# Patient Record
Sex: Female | Born: 1995 | Race: Black or African American | Hispanic: No | Marital: Single | Smoking: Never smoker
Health system: Southern US, Community
[De-identification: ages and names within clinical notes are randomized; demographics above are authoritative.]

## PROBLEM LIST (undated history)

## (undated) ENCOUNTER — Inpatient Hospital Stay (HOSPITAL_COMMUNITY): Payer: Self-pay

---

## 2015-11-26 ENCOUNTER — Encounter (HOSPITAL_COMMUNITY): Payer: Self-pay

## 2015-11-26 ENCOUNTER — Inpatient Hospital Stay (HOSPITAL_COMMUNITY): Payer: Self-pay

## 2015-11-26 ENCOUNTER — Inpatient Hospital Stay (HOSPITAL_COMMUNITY)
Admission: AD | Admit: 2015-11-26 | Discharge: 2015-11-26 | Disposition: A | Discharge status: Home / Self Care | Payer: BLUE CROSS/BLUE SHIELD | Source: Ambulatory Visit | Attending: Obstetrics & Gynecology | Admitting: Obstetrics & Gynecology

## 2015-11-26 DIAGNOSIS — O2341 Unspecified infection of urinary tract in pregnancy, first trimester: Secondary | ICD-10-CM | POA: Diagnosis not present

## 2015-11-26 DIAGNOSIS — Z91018 Allergy to other foods: Secondary | ICD-10-CM | POA: Insufficient documentation

## 2015-11-26 DIAGNOSIS — Z3A08 8 weeks gestation of pregnancy: Secondary | ICD-10-CM

## 2015-11-26 DIAGNOSIS — O209 Hemorrhage in early pregnancy, unspecified: Secondary | ICD-10-CM | POA: Diagnosis not present

## 2015-11-26 DIAGNOSIS — K5900 Constipation, unspecified: Secondary | ICD-10-CM | POA: Insufficient documentation

## 2015-11-26 DIAGNOSIS — N3000 Acute cystitis without hematuria: Secondary | ICD-10-CM

## 2015-11-26 DIAGNOSIS — Z3A01 Less than 8 weeks gestation of pregnancy: Secondary | ICD-10-CM | POA: Insufficient documentation

## 2015-11-26 LAB — WET PREP, GENITAL
Clue Cells Wet Prep HPF POC: NONE SEEN
SPERM: NONE SEEN
TRICH WET PREP: NONE SEEN
YEAST WET PREP: NONE SEEN

## 2015-11-26 LAB — POCT PREGNANCY, URINE: PREG TEST UR: POSITIVE — AB

## 2015-11-26 LAB — URINALYSIS, ROUTINE W REFLEX MICROSCOPIC
Glucose, UA: NEGATIVE mg/dL
KETONES UR: 40 mg/dL — AB
NITRITE: POSITIVE — AB
Protein, ur: >300 mg/dL — AB
Specific Gravity, Urine: 1.02 (ref 1.005–1.030)
pH: 5 (ref 5.0–8.0)

## 2015-11-26 LAB — HCG, QUANTITATIVE, PREGNANCY: hCG, Beta Chain, Quant, S: 19 m[IU]/mL — ABNORMAL HIGH (ref <5)

## 2015-11-26 LAB — URINE MICROSCOPIC-ADD ON

## 2015-11-26 LAB — CBC WITH DIFFERENTIAL/PLATELET
BASOS ABS: 0 10*3/uL (ref 0.0–0.1)
Basophils Relative: 0 %
Eosinophils Absolute: 0.1 10*3/uL (ref 0.0–0.7)
Eosinophils Relative: 2 %
HEMATOCRIT: 35.8 % — AB (ref 36.0–46.0)
Hemoglobin: 11.9 g/dL — ABNORMAL LOW (ref 12.0–15.0)
LYMPHS ABS: 2.4 10*3/uL (ref 0.7–4.0)
LYMPHS PCT: 36 %
MCH: 28.8 pg (ref 26.0–34.0)
MCHC: 33.2 g/dL (ref 30.0–36.0)
MCV: 86.7 fL (ref 78.0–100.0)
MONO ABS: 0.3 10*3/uL (ref 0.1–1.0)
Monocytes Relative: 5 %
NEUTROS ABS: 3.8 10*3/uL (ref 1.7–7.7)
Neutrophils Relative %: 57 %
Platelets: 227 10*3/uL (ref 150–400)
RBC: 4.13 MIL/uL (ref 3.87–5.11)
RDW: 13.3 % (ref 11.5–15.5)
WBC: 6.6 10*3/uL (ref 4.0–10.5)

## 2015-11-26 LAB — ABO/RH: ABO/RH(D): B POS

## 2015-11-26 MED ORDER — NITROFURANTOIN MONOHYD MACRO 100 MG PO CAPS: 100.0000 mg | ORAL_CAPSULE | Freq: Two times a day (BID) | ORAL | Status: AC

## 2015-11-26 NOTE — MAU Note (Signed)
Had 6 positive pregnancy, LMP 10/25/15, woke up with bleeding and cramping, not as heavy as a period.

## 2015-11-26 NOTE — MAU Provider Note (Signed)
History     CSN: [id number]  Arrival date and time: 11/26/15 1211   First Provider Initiated Contact with Patient 11/26/15 1236      Chief Complaint  Patient presents with  . Vaginal Bleeding  . Abdominal Cramping   HPI [name] is a 20 y.o. G1P0 at [redacted]w[redacted]d who presents to MAU today with complaint of vaginal bleeding and abdominal cramping since last night. She states spotting started last night and is now light bleeding. She states mild associated cramping rated at 4/10. She has not taken anything for pain. She states multiple +HPTs recently. She denies N/V/D or UTI symptoms. She does endorse mild constipation x 1 week. She also states a clear mucus discharge prior to onset of bleeding.    OB History    Gravida Para Term Preterm AB TAB SAB Ectopic Multiple Living   1               History reviewed. No pertinent past medical history.  History reviewed. No pertinent past surgical history.  No family history on file.  Social History  Substance Use Topics  . Smoking status: Never Smoker   . Smokeless tobacco: None  . Alcohol Use: No    Allergies:  Allergies  Allergen Reactions  . Other Rash    Carrot and celery    No prescriptions prior to admission    Review of Systems  Constitutional: Negative for fever and malaise/fatigue.  Gastrointestinal: Positive for abdominal pain and constipation. Negative for nausea, vomiting and diarrhea.  Genitourinary: Negative for dysuria, urgency and frequency.       + vaginal bleeding   Physical Exam   Blood pressure 135/63, pulse 81, temperature 98.7 F (37.1 C), temperature source Oral, resp. rate 18, height 5' (1.524 m), weight 106 lb (48.081 kg), last menstrual period 10/25/2015.  Physical Exam  Nursing note and vitals reviewed. Constitutional: She is oriented to person, place, and time. She appears well-developed and well-nourished. No distress.  HENT:  Head: Normocephalic and atraumatic.  Cardiovascular:  Normal rate.   Respiratory: Effort normal.  GI: Soft. She exhibits no distension and no mass. There is no tenderness. There is no rebound and no guarding.  Genitourinary: Uterus is not enlarged and not tender. Cervix exhibits no motion tenderness, no discharge and no friability. Right adnexum displays no mass and no tenderness. Left adnexum displays no mass and no tenderness. There is bleeding (small blood in the vaginal vault) in the vagina. No vaginal discharge found.  Cervix: closed, thick  Neurological: She is alert and oriented to person, place, and time.  Skin: Skin is warm and dry. No erythema.  Psychiatric: She has a normal mood and affect.     Results for orders placed or performed during the hospital encounter of 11/26/15 (from the past 24 hour(s))  Urinalysis, Routine w reflex microscopic (not at [facility])     Status: Abnormal   Collection Time: 11/26/15 12:20 PM  Result Value Ref Range   Color, Urine RED (A) YELLOW   APPearance CLOUDY (A) CLEAR   Specific Gravity, Urine 1.020 1.005 - 1.030   pH 5.0 5.0 - 8.0   Glucose, UA NEGATIVE NEGATIVE mg/dL   Hgb urine dipstick LARGE (A) NEGATIVE   Bilirubin Urine SMALL (A) NEGATIVE   Ketones, ur 40 (A) NEGATIVE mg/dL   Protein, ur >409 (A) NEGATIVE mg/dL   Nitrite POSITIVE (A) NEGATIVE   Leukocytes, UA MODERATE (A) NEGATIVE  Urine microscopic-add on     Status:  Abnormal   Collection Time: 11/26/15 12:20 PM  Result Value Ref Range   Squamous Epithelial / LPF 0-5 (A) NONE SEEN   WBC, UA 0-5 0 - 5 WBC/hpf   RBC / HPF TOO NUMEROUS TO COUNT 0 - 5 RBC/hpf   Bacteria, UA FEW (A) NONE SEEN   Urine-Other MICROSCOPIC EXAM PERFORMED ON UNCONCENTRATED URINE   Pregnancy, urine POC     Status: Abnormal   Collection Time: 11/26/15 12:36 PM  Result Value Ref Range   Preg Test, Ur POSITIVE (A) NEGATIVE  Wet prep, genital     Status: Abnormal   Collection Time: 11/26/15 12:40 PM  Result Value Ref Range   Yeast Wet Prep HPF POC NONE SEEN NONE SEEN    Trich, Wet Prep NONE SEEN NONE SEEN   Clue Cells Wet Prep HPF POC NONE SEEN NONE SEEN   WBC, Wet Prep HPF POC FEW (A) NONE SEEN   Sperm NONE SEEN   hCG, quantitative, pregnancy     Status: Abnormal   Collection Time: 11/26/15 12:47 PM  Result Value Ref Range   hCG, Beta Chain, Quant, S 19 (H) <5 mIU/mL  CBC with Differential/Platelet     Status: Abnormal   Collection Time: 11/26/15 12:48 PM  Result Value Ref Range   WBC 6.6 4.0 - 10.5 K/uL   RBC 4.13 3.87 - 5.11 MIL/uL   Hemoglobin 11.9 (L) 12.0 - 15.0 g/dL   HCT 29.5 (L) 62.1 - 30.8 %   MCV 86.7 78.0 - 100.0 fL   MCH 28.8 26.0 - 34.0 pg   MCHC 33.2 30.0 - 36.0 g/dL   RDW 65.7 84.6 - 96.2 %   Platelets 227 150 - 400 K/uL   Neutrophils Relative % 57 %   Neutro Abs 3.8 1.7 - 7.7 K/uL   Lymphocytes Relative 36 %   Lymphs Abs 2.4 0.7 - 4.0 K/uL   Monocytes Relative 5 %   Monocytes Absolute 0.3 0.1 - 1.0 K/uL   Eosinophils Relative 2 %   Eosinophils Absolute 0.1 0.0 - 0.7 K/uL   Basophils Relative 0 %   Basophils Absolute 0.0 0.0 - 0.1 K/uL  ABO/Rh     Status: None   Collection Time: 11/26/15 12:48 PM  Result Value Ref Range   ABO/RH(D) B POS    US Ob Comp Less 14 Wks  11/26/2015  CLINICAL DATA:  Vaginal bleeding before [redacted] weeks gestation, bleeding and cramping today, quantitative beta HCG 19 EXAM: OBSTETRIC <14 WK [place] AND TRANSVAGINAL OB US TECHNIQUE: Both transabdominal and transvaginal ultrasound examinations were performed for complete evaluation of the gestation as well as the maternal uterus, adnexal regions, and pelvic cul-de-sac. Transvaginal technique was performed to assess early pregnancy. COMPARISON:  None FINDINGS: Intrauterine gestational sac:  Not identified Yolk sac:  N/A Embryo:  N/A Cardiac Activity: N/A Heart Rate: N/A  bpm Subchorionic hemorrhage:  None visualized. Maternal uterus/adnexae: Retroverted uterus. No uterine mass, gestational sac or abnormal fluid collection. Trace free pelvic fluid. Unremarkable  ovaries. No adnexal masses. IMPRESSION: No intrauterine gestation identified. Findings are compatible with pregnancy of unknown location. Differential diagnosis includes early intrauterine pregnancy too early to visualize, spontaneous abortion, and ectopic pregnancy. Serial quantitative beta HCG and or followup ultrasound recommended to definitively exclude ectopic pregnancy. Electronically Signed   By: Ulyses Southward M.D.   On: 11/26/2015 13:54   US Ob Transvaginal  11/26/2015  CLINICAL DATA:  Vaginal bleeding before [redacted] weeks gestation, bleeding and cramping today, quantitative beta HCG  19 EXAM: OBSTETRIC <14 WK US AND TRANSVAGINAL OB US TECHNIQUE: Both transabdominal and transvaginal ultrasound examinations were performed for complete evaluation of the gestation as well as the maternal uterus, adnexal regions, and pelvic cul-de-sac. Transvaginal technique was performed to assess early pregnancy. COMPARISON:  None FINDINGS: Intrauterine gestational sac:  Not identified Yolk sac:  N/A Embryo:  N/A Cardiac Activity: N/A Heart Rate: N/A  bpm Subchorionic hemorrhage:  None visualized. Maternal uterus/adnexae: Retroverted uterus. No uterine mass, gestational sac or abnormal fluid collection. Trace free pelvic fluid. Unremarkable ovaries. No adnexal masses. IMPRESSION: No intrauterine gestation identified. Findings are compatible with pregnancy of unknown location. Differential diagnosis includes early intrauterine pregnancy too early to visualize, spontaneous abortion, and ectopic pregnancy. Serial quantitative beta HCG and or followup ultrasound recommended to definitively exclude ectopic pregnancy. Electronically Signed   By: Ulyses SouthwardMark  Boles M.D.   On: 11/26/2015 13:54    MAU Course  Procedures None  MDM +UPT UA, wet prep, GC/chlamydia, CBC, ABO/Rh, quant hCG, HIV, RPR and US today to rule out ectopic pregnancy Patient advised of need for follow-up, would suggest 48 hour follow-up, however patient is returning  to [place] today. Advised to call OB practice there that she was planning to see next week for follow-up instructions.  Assessment and Plan  A: Positive pregnancy test Pregnancy of unknown location Vaginal bleeding in pregnancy  UTI in pregnancy  P: Discharge home Rx for Macrobid given to patient Urine culture pending Bleeding/ectopic precautions discussed Patient advised to follow-up with OB provider in [place] in ~ 48 hours Patient may return to MAU as needed or if her condition were to change or worsen  Marny LowensteinJulie N Wenzel, PA-C  11/26/2015, 2:45 PM

## 2015-11-26 NOTE — Discharge Instructions (Signed)

## 2015-11-27 LAB — HIV ANTIBODY (ROUTINE TESTING W REFLEX): HIV Screen 4th Generation wRfx: NONREACTIVE

## 2015-11-27 LAB — RPR: RPR: NONREACTIVE

## 2015-11-28 LAB — CULTURE, OB URINE

## 2015-11-29 LAB — GC/CHLAMYDIA PROBE AMP (~~LOC~~) NOT AT ARMC
Chlamydia: NEGATIVE
Neisseria Gonorrhea: NEGATIVE

## 2016-09-30 ENCOUNTER — Encounter (HOSPITAL_COMMUNITY): Payer: Self-pay

## 2016-11-29 IMAGING — US US OB TRANSVAGINAL
1 series · 15 of 28 positions shown · non-contrast
Comparison: None

CLINICAL DATA: Vaginal bleeding before 22 weeks gestation, bleeding
and cramping today, quantitative beta HCG 19

EXAM:
OBSTETRIC <14 WK US AND TRANSVAGINAL OB US
TECHNIQUE: Both transabdominal and transvaginal ultrasound examinations were
performed for complete evaluation of the gestation as well as the
maternal uterus, adnexal regions, and pelvic cul-de-sac.
Transvaginal technique was performed to assess early pregnancy.

[Series 1: us ob transvaginal · 15 of 57 slices shown]
[im 1/57]
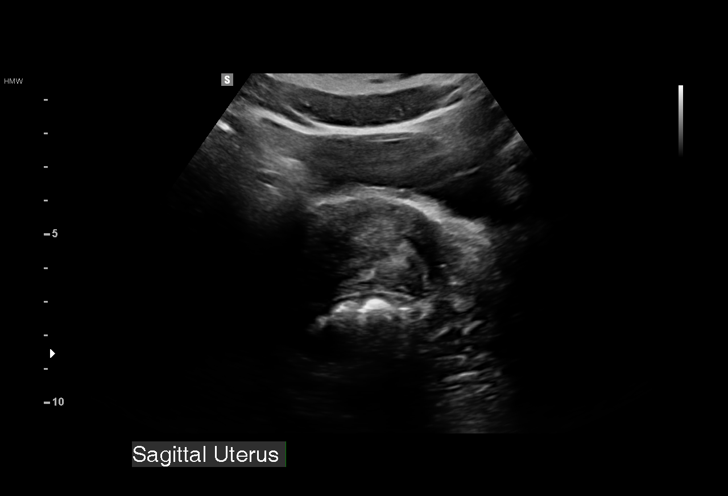
[im 5/57]
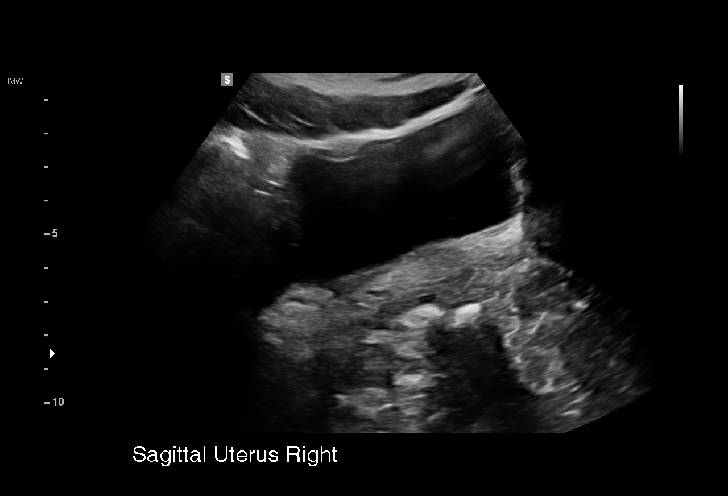
[im 9/57]
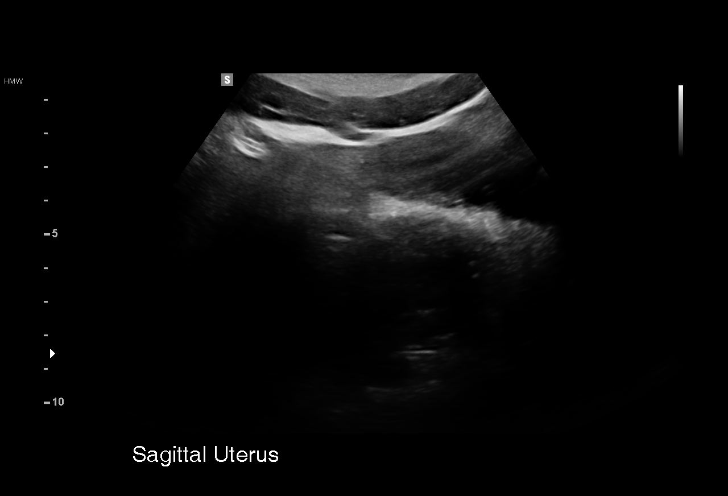
[im 13/57]
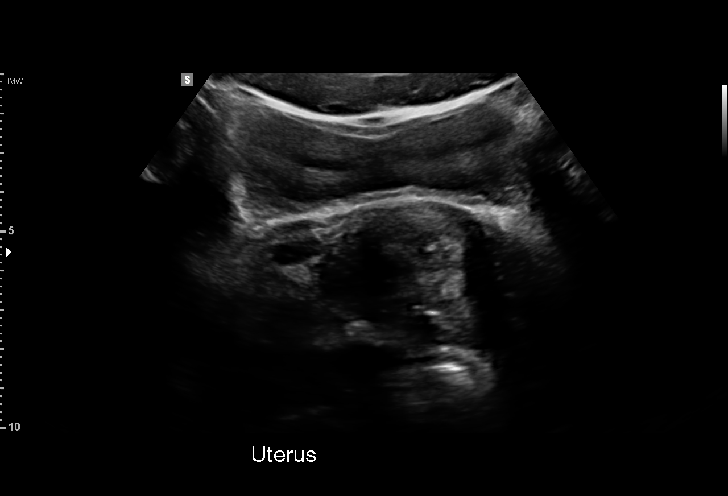
[im 17/57]
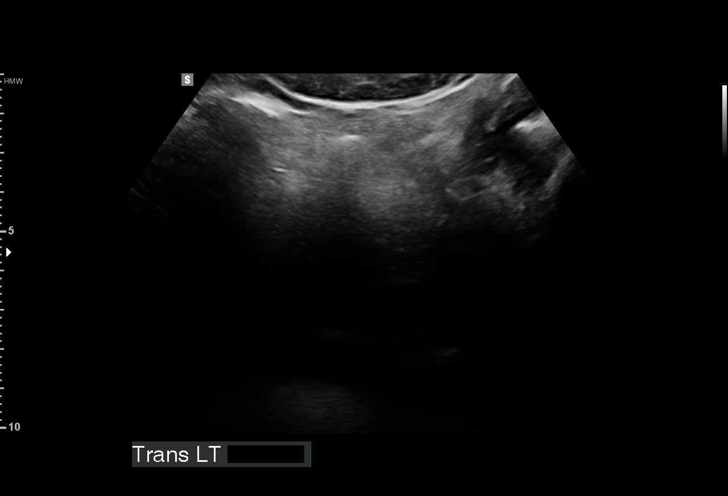
[im 21/57]
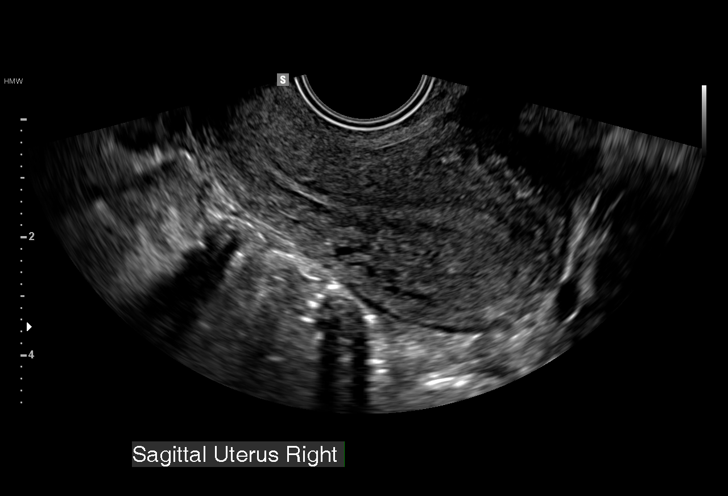
[im 25/57]
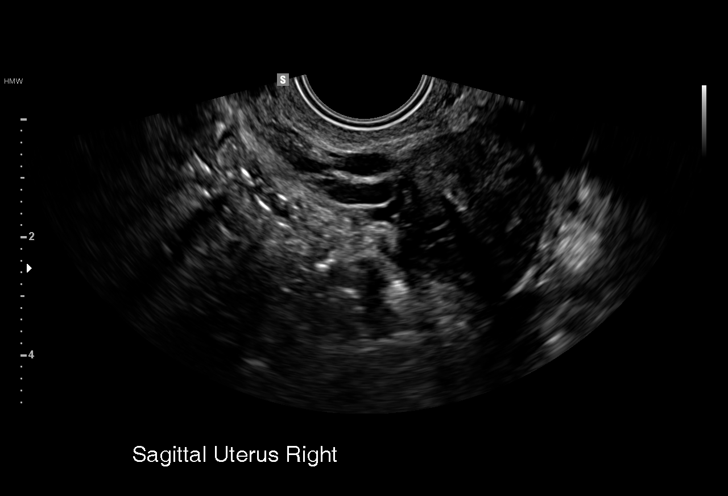
[im 30/57]
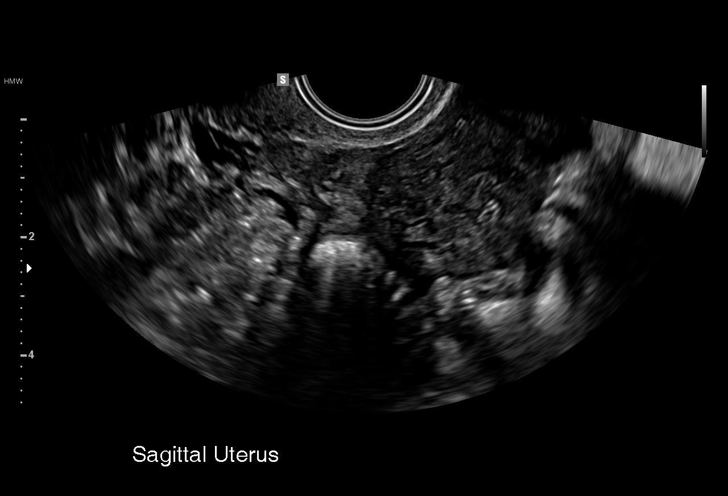
[im 32/57]
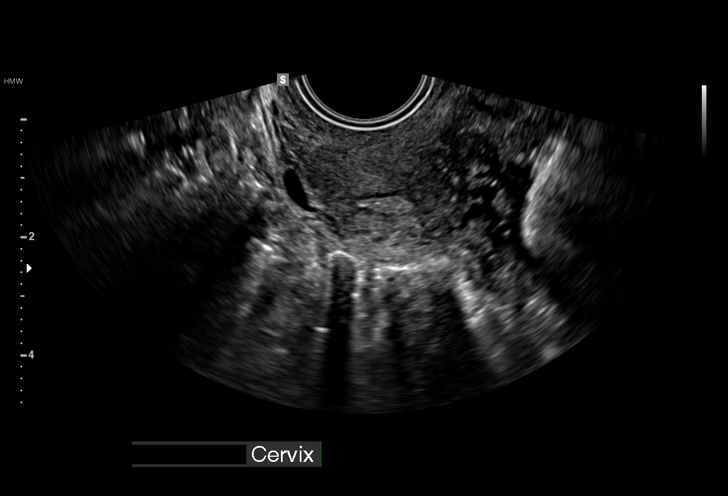
[im 36/57]
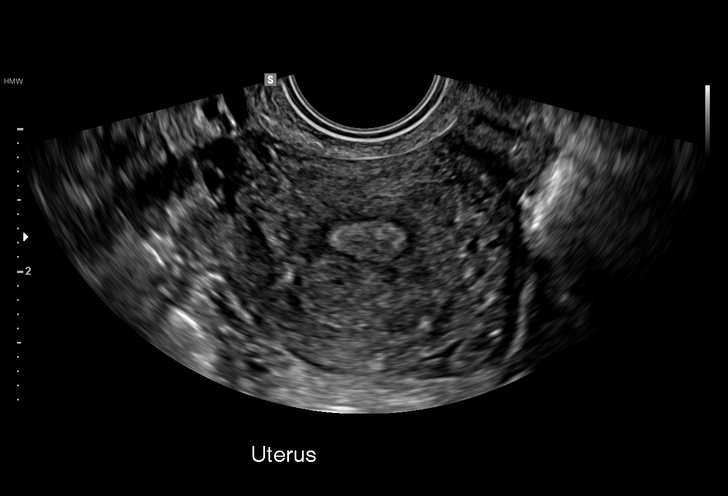
[im 40/57]
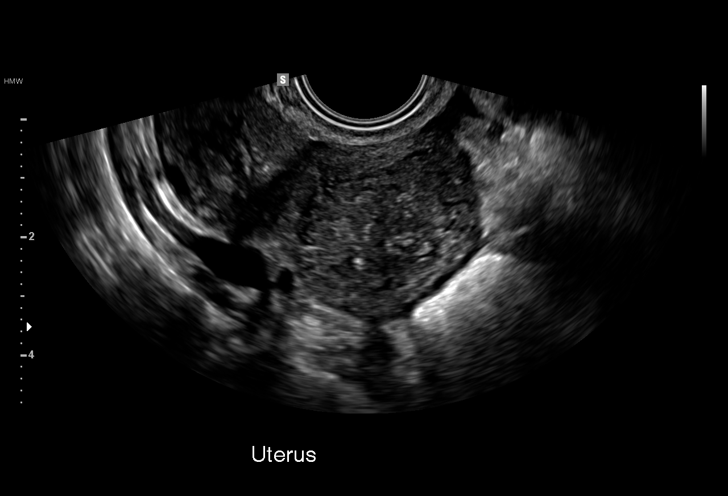
[im 44/57]
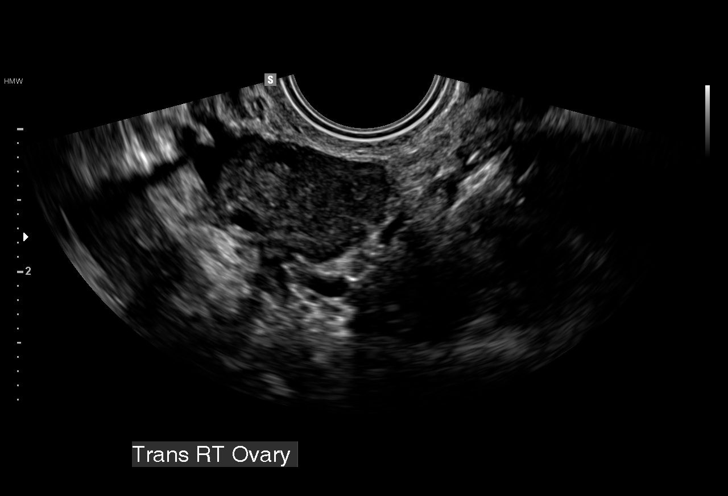
[im 48/57]
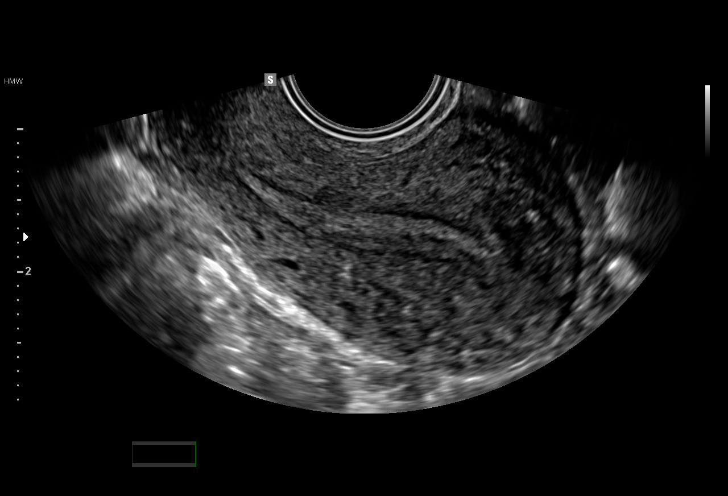
[im 52/57]
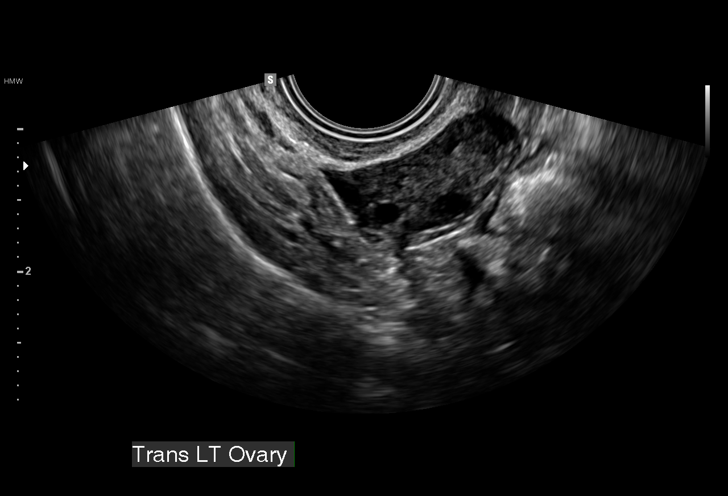
[im 57/57]
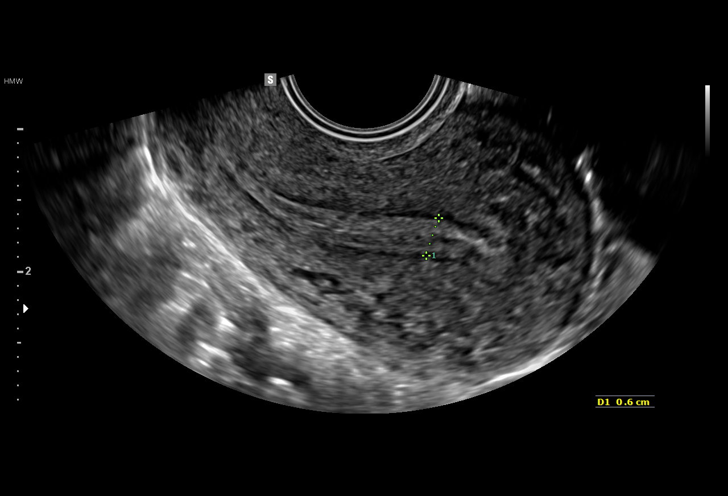

[15 of 28 positions shown; findings below may reference images not displayed]

FINDINGS: Intrauterine gestational sac:  Not identified

Yolk sac:  N/A

Embryo:  N/A

Cardiac Activity: N/A

Heart Rate: N/A  bpm

Subchorionic hemorrhage:  None visualized.

Maternal uterus/adnexae:

Retroverted uterus.

No uterine mass, gestational sac or abnormal fluid collection.

Trace free pelvic fluid.

Unremarkable ovaries.

No adnexal masses.
IMPRESSION: No intrauterine gestation identified.

Findings are compatible with pregnancy of unknown location.

Differential diagnosis includes early intrauterine pregnancy too
early to visualize, spontaneous abortion, and ectopic pregnancy.

Serial quantitative beta HCG and or followup ultrasound recommended
to definitively exclude ectopic pregnancy.
